# Patient Record
Sex: Female | Born: 1980
Health system: Southern US, Community
[De-identification: ages and names within clinical notes are randomized; demographics above are authoritative.]

## PROBLEM LIST (undated history)

## (undated) DIAGNOSIS — Z8619 Personal history of other infectious and parasitic diseases: Secondary | ICD-10-CM

## (undated) DIAGNOSIS — E785 Hyperlipidemia, unspecified: Secondary | ICD-10-CM

## (undated) HISTORY — DX: Hyperlipidemia, unspecified: E78.5

## (undated) HISTORY — PX: HYSTEROSCOPY WITH D & C: SHX1775

## (undated) HISTORY — DX: Personal history of other infectious and parasitic diseases: Z86.19

---

## 2018-12-26 ENCOUNTER — Encounter: Payer: Self-pay | Admitting: Family Medicine

## 2018-12-26 ENCOUNTER — Other Ambulatory Visit: Payer: Self-pay

## 2018-12-26 ENCOUNTER — Ambulatory Visit (INDEPENDENT_AMBULATORY_CARE_PROVIDER_SITE_OTHER): Payer: BC Managed Care – PPO | Admitting: Family Medicine

## 2018-12-26 VITALS — BP 122/76 | HR 78 | Temp 98.4°F | Resp 12 | Ht 66.0 in | Wt 181.4 lb

## 2018-12-26 DIAGNOSIS — Z13220 Encounter for screening for lipoid disorders: Secondary | ICD-10-CM

## 2018-12-26 DIAGNOSIS — Z13228 Encounter for screening for other metabolic disorders: Secondary | ICD-10-CM

## 2018-12-26 DIAGNOSIS — Z1329 Encounter for screening for other suspected endocrine disorder: Secondary | ICD-10-CM | POA: Diagnosis not present

## 2018-12-26 DIAGNOSIS — Z0000 Encounter for general adult medical examination without abnormal findings: Secondary | ICD-10-CM | POA: Diagnosis not present

## 2018-12-26 DIAGNOSIS — E049 Nontoxic goiter, unspecified: Secondary | ICD-10-CM | POA: Diagnosis not present

## 2018-12-26 DIAGNOSIS — Z130 Encounter for screening for diseases of the blood and blood-forming organs and certain disorders involving the immune mechanism: Secondary | ICD-10-CM

## 2018-12-26 LAB — BASIC METABOLIC PANEL
BUN: 16 mg/dL (ref 6–23)
CO2: 29 mEq/L (ref 19–32)
Calcium: 9.6 mg/dL (ref 8.4–10.5)
Chloride: 105 mEq/L (ref 96–112)
Creatinine, Ser: 0.9 mg/dL (ref 0.40–1.20)
GFR: 70.8 mL/min (ref >60.00)
Glucose, Bld: 86 mg/dL (ref 70–99)
Potassium: 4.1 mEq/L (ref 3.5–5.1)
Sodium: 141 mEq/L (ref 135–145)

## 2018-12-26 LAB — LIPID PANEL
Cholesterol: 207 mg/dL — ABNORMAL HIGH (ref 0–200)
HDL: 65.1 mg/dL (ref >39.00)
LDL Cholesterol: 130 mg/dL — ABNORMAL HIGH (ref 0–99)
NonHDL: 141.49
Total CHOL/HDL Ratio: 3
Triglycerides: 55 mg/dL (ref 0.0–149.0)
VLDL: 11 mg/dL (ref 0.0–40.0)

## 2018-12-26 LAB — TSH: TSH: 0.95 u[IU]/mL (ref 0.35–4.50)

## 2018-12-26 LAB — HEMOGLOBIN A1C: Hgb A1c MFr Bld: 5.3 % (ref 4.6–6.5)

## 2018-12-26 NOTE — Progress Notes (Signed)
HPI:   [name] is a 38 y.o. female, who is here today to establish care.  Former PCP: She moved from [place] a year ago. Last preventive routine visit: 2-3 years ago.  Chronic medical problems: Allergies,recurrent urticaria,HLD (dx'ed as a teenager). Recurrent urticaria for about 8 months. Since she started Claritin 10 mg daily,she has not had episodes or urticaria. She has not identified exacerbating factors.  HLD in her late teens. Report normal FLP 2-3 years ago.  Concerns today: None. She agrees with doing a CPE today.  She lives with her husband and 2 children (5 and 649 yo). She exercises regularly, jogs and bikes. She follows a healthful diet,following We[employer] Watchers.  Last pap smear in 2015. She is planning on establishing with gyn. Denies Hx of abnormal pap smear. G: 3 L:2 A:1 Birth control: Condoms.  Tdap in 2016.  Review of Systems  Constitutional: Negative for appetite change, fatigue and fever.  HENT: Negative for hearing loss, mouth sores, sore throat and trouble swallowing.   Eyes: Negative for redness and visual disturbance.  Respiratory: Negative for cough, shortness of breath and wheezing.   Cardiovascular: Negative for chest pain and leg swelling.  Gastrointestinal: Negative for abdominal pain, nausea and vomiting.       No changes in bowel habits.  Endocrine: Negative for cold intolerance, heat intolerance, polydipsia, polyphagia and polyuria.  Genitourinary: Negative for decreased urine volume, dysuria, hematuria, vaginal bleeding and vaginal discharge.  Musculoskeletal: Negative for gait problem and myalgias.  Skin: Negative for color change and rash.  Allergic/Immunologic: Positive for environmental allergies.  Neurological: Negative for syncope, weakness and headaches.  Hematological: Negative for adenopathy. Does not bruise/bleed easily.  Psychiatric/Behavioral: Negative for confusion and sleep disturbance. The patient is not  nervous/anxious.   All other systems reviewed and are negative.  Current Outpatient Medications on File Prior to Visit  Medication Sig Dispense Refill   b complex vitamins tablet Take 1 tablet by mouth daily.     Biotin 1610910000 MCG TABS Take 1 tablet by mouth daily.     Chlorpheniramine Maleate (ALLERGY PO) Take 1 tablet by mouth daily.     Multiple Vitamins-Minerals (MULTIVITAMIN ADULT) CHEW Chew 1 tablet by mouth daily.     VITAMIN D PO Take 2,000 Units by mouth daily.     No current facility-administered medications on file prior to visit.     Past Medical History:  Diagnosis Date   History of chicken pox    Hyperlipidemia    Allergies  Allergen Reactions   Cashew Nut Oil Hives   Ginger Hives   Macadamia Nut Oil Hives   Pumpkin Seed Hives   Pecan Nut (Diagnostic) Rash   Pistachio Nut (Diagnostic) Rash    Family History  Problem Relation Age of Onset   Arthritis Mother    Diabetes Mother    Cancer Father    Drug abuse Father    Early death Father    Depression Sister    Depression Brother    Arthritis Maternal Grandmother    Depression Maternal Grandmother    Birth defects Maternal Grandfather    Drug abuse Maternal Grandfather    Heart disease Paternal Grandmother    Depression Sister    Learning disabilities Sister     Social History   Socioeconomic History   Marital status: Married    Spouse name: Not on file   Number of children: 2   Years of education: Not on file  Highest education level: Not on file  Occupational History   Not on file  Social Needs   Financial resource strain: Not on file   Food insecurity    Worry: Not on file    Inability: Not on file   Transportation needs    Medical: Not on file    Non-medical: Not on file  Tobacco Use   Smoking status: Never Smoker   Smokeless tobacco: Never Used  Substance and Sexual Activity   Alcohol use: Yes   Drug use: Never   Sexual activity: Yes    Lifestyle   Physical activity    Days per week: 3 days    Minutes per session: 30 min   Stress: Not on file  Relationships   Social connections    Talks on phone: Not on file    Gets together: Not on file    Attends religious service: Not on file    Active member of club or organization: Not on file    Attends meetings of clubs or organizations: Not on file    Relationship status: Not on file  Other Topics Concern   Not on file  Social History Narrative   Not on file    Vitals:   12/26/18 0839  BP: 122/76  Pulse: 78  Resp: 12  Temp: 98.4 F (36.9 C)  SpO2: 98%    Body mass index is 29.27 kg/m.  Physical Exam  Nursing note and vitals reviewed. Constitutional: She is oriented to person, place, and time. She appears well-developed. No distress.  HENT:  Head: Normocephalic and atraumatic.  Right Ear: Hearing, tympanic membrane, external ear and ear canal normal.  Left Ear: Hearing, tympanic membrane, external ear and ear canal normal.  Mouth/Throat: Uvula is midline, oropharynx is clear and moist and mucous membranes are normal.  Eyes: Pupils are equal, round, and reactive to light. Conjunctivae and EOM are normal.  Neck: No tracheal deviation present. Thyromegaly (? right thyroid nodule) present.  Cardiovascular: Normal rate and regular rhythm.  No murmur heard. Pulses:      Dorsalis pedis pulses are 2+ on the right side and 2+ on the left side.  Respiratory: Effort normal and breath sounds normal. No respiratory distress.  GI: Soft. She exhibits no mass. There is no hepatomegaly. There is no abdominal tenderness.  Genitourinary:    Genitourinary Comments: Deferred to gyn.   Musculoskeletal:        General: No edema.     Comments: No major deformity or signs of synovitis appreciated.  Lymphadenopathy:    She has no cervical adenopathy.       Right: No supraclavicular adenopathy present.       Left: No supraclavicular adenopathy present.  Neurological: She  is alert and oriented to person, place, and time. She has normal strength. No cranial nerve deficit. Coordination and gait normal.  Reflex Scores:      Bicep reflexes are 2+ on the right side and 2+ on the left side.      Patellar reflexes are 2+ on the right side and 2+ on the left side. Skin: Skin is warm. No rash noted. No erythema.  Psychiatric: She has a normal mood and affect. Cognition and memory are normal.  Well groomed, good eye contact.    ASSESSMENT AND PLAN:  Ms. Odilia was seen today for establish care and annual exam.  Diagnoses and all orders for this visit:  Lab Results  Component Value Date   HGBA1C 5.3 12/26/2018  Lab Results  Component Value Date   CHOL 207 (H) 12/26/2018   HDL 65.10 12/26/2018   LDLCALC 130 (H) 12/26/2018   TRIG 55.0 12/26/2018   CHOLHDL 3 12/26/2018   Lab Results  Component Value Date   TSH 0.95 12/26/2018   Lab Results  Component Value Date   CREATININE 0.90 12/26/2018   BUN 16 12/26/2018   NA 141 12/26/2018   K 4.1 12/26/2018   CL 105 12/26/2018   CO2 29 12/26/2018    Routine general medical examination at a health care facility We discussed the importance of regular physical activity and healthy diet for prevention of chronic illness and/or complications. Preventive guidelines reviewed. Vaccination up to date,refused influenza vaccine. Continue female preventive care with gyn. Next CPE in a year.  Enlarged thyroid gland ? Right thyroid nodule. Further recommendations will be given according to labs/imaging results.  -     TSH -     US THYROID; Future  Screening for lipoid disorders -     Lipid panel  Screening for endocrine, metabolic and immunity disorder -     Basic metabolic panel -     Hemoglobin A1c    Return in 1 year (on 12/26/2019) for cpe.     Keajah Killough G. Martinique, MD  [facility]. [place] office.

## 2018-12-26 NOTE — Patient Instructions (Addendum)
Today you have you routine preventive visit.  At least 150 minutes of moderate exercise per week, daily brisk walking for 15-30 min is a good exercise option. Healthy diet low in saturated (animal) fats and sweets and consisting of fresh fruits and vegetables, lean meats such as fish and white chicken and whole grains.  These are some of recommendations for screening depending of age and risk factors: A few things to remember from today's visit:   Routine general medical examination at a health care facility  Enlarged thyroid gland - Plan: TSH, US THYROID  Screening for lipoid disorders - Plan: Lipid panel  Screening for endocrine, metabolic and immunity disorder - Plan: Basic metabolic panel, Hemoglobin A1c  Dr Talbert Nan gyn. Specialties and/or Subspecialties Gynecology Only  [phone]   - Vaccines:  Tdap vaccine every 10 years.  Shingles vaccine recommended at age 90, could be given after 36 years of age but not sure about insurance coverage.   Pneumonia vaccines:  Prevnar 13 at 65 and Pneumovax at 47. Sometimes Pneumovax is giving earlier if history of smoking, lung disease,diabetes,kidney disease among some.    Screening for diabetes at age 34 and every 3 years.  Cervical cancer prevention:  Pap smear starts at 36 years of age and continues periodically until 36 years old in low risk women. Pap smear every 3 years between 65 and 61 years old. Pap smear every 3-5 years between women 18 and older if pap smear negative and HPV screening negative.   -Breast cancer: Mammogram: There is disagreement between experts about when to start screening in low risk asymptomatic female but recent recommendations are to start screening at 37 and not later than 36 years old , every 1-2 years and after 36 yo q 2 years. Screening is recommended until 36 years old but some women can continue screening depending of healthy issues.   Colon cancer screening: starts at 36 years old until 36  years old.  Cholesterol disorder screening at age 77 and every 3 years.  Also recommended:  1. Dental visit- Brush and floss your teeth twice daily; visit your dentist twice a year. 2. Eye doctor- Get an eye exam at least every 2 years. 3. Helmet use- Always wear a helmet when riding a bicycle, motorcycle, rollerblading or skateboarding. 4. Safe sex- If you may be exposed to sexually transmitted infections, use a condom. 5. Seat belts- Seat belts can save your live; always wear one. 6. Smoke/Carbon Monoxide detectors- These detectors need to be installed on the appropriate level of your home. Replace batteries at least once a year. 7. Skin cancer- When out in the sun please cover up and use sunscreen 15 SPF or higher. 8. Violence- If anyone is threatening or hurting you, please tell your healthcare provider.  9. Drink alcohol in moderation- Limit alcohol intake to one drink or less per day. Never drink and drive.

## 2019-01-13 ENCOUNTER — Ambulatory Visit
Admission: RE | Admit: 2019-01-13 | Discharge: 2019-01-13 | Disposition: A | Discharge status: Home / Self Care | Payer: BC Managed Care – PPO | Source: Ambulatory Visit | Attending: Family Medicine | Admitting: Family Medicine

## 2019-01-13 DIAGNOSIS — E041 Nontoxic single thyroid nodule: Secondary | ICD-10-CM | POA: Diagnosis not present

## 2019-01-13 DIAGNOSIS — E049 Nontoxic goiter, unspecified: Secondary | ICD-10-CM

## 2019-01-18 ENCOUNTER — Other Ambulatory Visit: Payer: Self-pay | Admitting: Family Medicine

## 2019-01-18 DIAGNOSIS — E041 Nontoxic single thyroid nodule: Secondary | ICD-10-CM

## 2019-01-26 ENCOUNTER — Telehealth: Payer: Self-pay | Admitting: *Deleted

## 2019-01-26 NOTE — Telephone Encounter (Signed)
Copied from [place] [id number]. Topic: Referral - Status >> Jan 25, 2019  4:11 PM Reyne Dumas L wrote: Reason for CRM:   Pt states that she had an ultrasound last week and was told she would have to have a neck biopsy.  Pt states she has had no follow up on that and would like to know what is going on. Pt can be reached at [phone]  Spoke with patient. Per patient spoke with the endocrinologist office, she has an appointment with them  02/15/2019. Per patient was told by Endocrinologist office she needs to have biopsy done before she sees them. Please advise

## 2019-02-01 NOTE — Telephone Encounter (Signed)
Spoke with Ms heyer to discussed thyroid US results and general plan in regard to management.  She is anxious because she is not sure what will be the next step.  Explained that because of characteristics of nodule, biopsy needs to be considered. Procedure may be performed in the office during initial evaluation,if indicated. We discussed Bx technique as well as treatment and prognosis depending of results.  She has an appt with Dr Loanne Drilling on 02/15/19.  She feels reassured and will keep appt with endocrinologist.  Betty Martinique, MD

## 2019-02-15 ENCOUNTER — Other Ambulatory Visit: Payer: Self-pay

## 2019-02-15 ENCOUNTER — Other Ambulatory Visit (HOSPITAL_COMMUNITY)
Admission: RE | Admit: 2019-02-15 | Discharge: 2019-02-15 | Disposition: A | Discharge status: Home / Self Care | Payer: BC Managed Care – PPO | Source: Ambulatory Visit | Attending: Endocrinology | Admitting: Endocrinology

## 2019-02-15 ENCOUNTER — Encounter: Payer: Self-pay | Admitting: Endocrinology

## 2019-02-15 ENCOUNTER — Ambulatory Visit (INDEPENDENT_AMBULATORY_CARE_PROVIDER_SITE_OTHER): Payer: BC Managed Care – PPO | Admitting: Endocrinology

## 2019-02-15 DIAGNOSIS — E041 Nontoxic single thyroid nodule: Secondary | ICD-10-CM | POA: Insufficient documentation

## 2019-02-15 NOTE — Patient Instructions (Addendum)
We'll let you know about the biopsy results.  If as expected, no cancer is found, Please come back for a follow-up appointment in 6 months.   

## 2019-02-15 NOTE — Progress Notes (Signed)
Subjective:    Patient ID: Kristina Rodgers, female    DOB: 05-Jul-1980, 38 y.o.   MRN: [id number]  HPI Pt is referred by Dr Martinique, for nodular thyroid.  Pt was noted on routine exam, to have a nodule at the thyroid in 2020.  she is unaware of ever having had thyroid problems in the past.  she has no h/o XRT or surgery to the neck.    Past Medical History:  Diagnosis Date  . History of chicken pox   . Hyperlipidemia     No past surgical history on file.  Social History   Socioeconomic History  . Marital status: Married    Spouse name: Not on file  . Number of children: 2  . Years of education: Not on file  . Highest education level: Not on file  Occupational History  . Not on file  Social Needs  . Financial resource strain: Not on file  . Food insecurity    Worry: Not on file    Inability: Not on file  . Transportation needs    Medical: Not on file    Non-medical: Not on file  Tobacco Use  . Smoking status: Never Smoker  . Smokeless tobacco: Never Used  Substance and Sexual Activity  . Alcohol use: Yes  . Drug use: Never  . Sexual activity: Yes  Lifestyle  . Physical activity    Days per week: 3 days    Minutes per session: 30 min  . Stress: Not on file  Relationships  . Social Herbalist on phone: Not on file    Gets together: Not on file    Attends religious service: Not on file    Active member of club or organization: Not on file    Attends meetings of clubs or organizations: Not on file    Relationship status: Not on file  . Intimate partner violence    Fear of current or ex partner: Not on file    Emotionally abused: Not on file    Physically abused: Not on file    Forced sexual activity: Not on file  Other Topics Concern  . Not on file  Social History Narrative  . Not on file    Current Outpatient Medications on File Prior to Visit  Medication Sig Dispense Refill  . b complex vitamins tablet Take 1 tablet by mouth daily.    . Biotin  10000 MCG TABS Take 1 tablet by mouth daily.    . Chlorpheniramine Maleate (ALLERGY PO) Take 1 tablet by mouth daily.    . Multiple Vitamins-Minerals (MULTIVITAMIN ADULT) CHEW Chew 1 tablet by mouth daily.    Marland Kitchen VITAMIN D PO Take 2,000 Units by mouth daily.     No current facility-administered medications on file prior to visit.     Allergies  Allergen Reactions  . Cashew Nut Oil Hives  . Ginger Hives  . Macadamia Nut Oil Hives  . Pumpkin Seed Hives  . Pecan Nut (Diagnostic) Rash  . Pistachio Nut (Diagnostic) Rash    Family History  Problem Relation Age of Onset  . Arthritis Mother   . Diabetes Mother   . Cancer Father   . Drug abuse Father   . Early death Father   . Depression Sister   . Thyroid disease Sister   . Depression Brother   . Arthritis Maternal Grandmother   . Depression Maternal Grandmother   . Birth defects Maternal Grandfather   . Drug  abuse Maternal Grandfather   . Heart disease Paternal Grandmother   . Depression Sister   . Learning disabilities Sister     BP 122/80   Pulse 71   Ht 5\' 6"  (1.676 m)   Wt 182 lb (82.6 kg)   SpO2 97%   BMI 29.38 kg/m    Review of Systems Denies hoarseness, neck pain, visual loss, chest pain, sob, cough, dysphagia, diarrhea, itching, flushing, easy bruising, depression, cold intolerance, headache, numbness, and rhinorrhea.  She has lost a few lbs, due to her efforts.      Objective:   Physical Exam VS: see vs page GEN: no distress HEAD: head: no deformity eyes: no periorbital swelling, no proptosis external nose and ears are normal NECK: 3 cm right thyroid nodule is noted.   CHEST WALL: no deformity LUNGS: clear to auscultation CV: reg rate and rhythm, no murmur ABD: abdomen is soft, nontender.  no hepatosplenomegaly.  not distended.  no hernia MUSCULOSKELETAL: muscle bulk and strength are grossly normal.  no obvious joint swelling.  gait is normal and steady EXTEMITIES: no deformity.  no edema PULSES: no  carotid bruit NEURO:  cn 2-12 grossly intact.   readily moves all 4's.  sensation is intact to touch on all 4's SKIN:  Normal texture and temperature.  No rash or suspicious lesion is visible.   NODES:  None palpable at the neck PSYCH: alert, well-oriented.  Does not appear anxious nor depressed.   I have reviewed outside records, and summarized: Pt was incidentally noted to have thyroid nodule, and referred here.  She was euthyroid.  Wellness was also addressed   Lab Results  Component Value Date   TSH 0.95 12/26/2018   02/25/2019: 3.6 cm nodule/mass replacing the mid and inferior aspects of the right lobe of the thyroid meets imaging criteria for bx  thyroid needle bx: consent obtained, signed form on chart The area is first sprayed with cooling agent local: xylocaine 2%, with epinephrine prep: alcohol pad 3 bxs are done with 25 and 27g needles no complications     Assessment & Plan:  Thyroid nodule, new, uncertain etiology   Patient Instructions  We'll let you know about the biopsy results.  If as expected, no cancer is found, Please come back for a follow-up appointment in 6 months.

## 2019-02-17 LAB — CYTOLOGY - NON PAP

## 2019-03-28 ENCOUNTER — Ambulatory Visit: Payer: BC Managed Care – PPO | Attending: Internal Medicine

## 2019-03-28 DIAGNOSIS — Z20822 Contact with and (suspected) exposure to covid-19: Secondary | ICD-10-CM

## 2019-03-30 LAB — NOVEL CORONAVIRUS, NAA: SARS-CoV-2, NAA: NOT DETECTED

## 2019-08-17 ENCOUNTER — Ambulatory Visit: Payer: BC Managed Care – PPO | Admitting: Endocrinology

## 2019-09-26 ENCOUNTER — Ambulatory Visit: Payer: Self-pay | Admitting: Endocrinology

## 2020-01-02 ENCOUNTER — Other Ambulatory Visit (HOSPITAL_COMMUNITY)
Admission: RE | Admit: 2020-01-02 | Discharge: 2020-01-02 | Disposition: A | Discharge status: Home / Self Care | Payer: BLUE CROSS/BLUE SHIELD | Source: Ambulatory Visit | Attending: Family Medicine | Admitting: Family Medicine

## 2020-01-02 ENCOUNTER — Ambulatory Visit (INDEPENDENT_AMBULATORY_CARE_PROVIDER_SITE_OTHER): Payer: BLUE CROSS/BLUE SHIELD | Admitting: Family Medicine

## 2020-01-02 ENCOUNTER — Encounter: Payer: Self-pay | Admitting: Family Medicine

## 2020-01-02 ENCOUNTER — Other Ambulatory Visit: Payer: Self-pay

## 2020-01-02 VITALS — BP 120/78 | HR 68 | Temp 98.2°F | Resp 16 | Ht 66.0 in | Wt 187.4 lb

## 2020-01-02 DIAGNOSIS — Z0000 Encounter for general adult medical examination without abnormal findings: Secondary | ICD-10-CM

## 2020-01-02 DIAGNOSIS — E041 Nontoxic single thyroid nodule: Secondary | ICD-10-CM | POA: Diagnosis not present

## 2020-01-02 DIAGNOSIS — Z124 Encounter for screening for malignant neoplasm of cervix: Secondary | ICD-10-CM

## 2020-01-02 DIAGNOSIS — M2550 Pain in unspecified joint: Secondary | ICD-10-CM

## 2020-01-02 DIAGNOSIS — Z1159 Encounter for screening for other viral diseases: Secondary | ICD-10-CM

## 2020-01-02 DIAGNOSIS — E785 Hyperlipidemia, unspecified: Secondary | ICD-10-CM | POA: Insufficient documentation

## 2020-01-02 DIAGNOSIS — Z1329 Encounter for screening for other suspected endocrine disorder: Secondary | ICD-10-CM

## 2020-01-02 DIAGNOSIS — Z13228 Encounter for screening for other metabolic disorders: Secondary | ICD-10-CM

## 2020-01-02 DIAGNOSIS — Z130 Encounter for screening for diseases of the blood and blood-forming organs and certain disorders involving the immune mechanism: Secondary | ICD-10-CM

## 2020-01-02 NOTE — Patient Instructions (Addendum)
Today you have you routine preventive visit. A few things to remember from today's visit:  Routine general medical examination at a health care facility  Encounter for HCV screening test for low risk patient - Plan: Hepatitis C antibody  Hyperlipidemia, unspecified hyperlipidemia type - Plan: Lipid panel  Polyarthralgia - Plan: Sedimentation rate, C-reactive protein, Rheumatoid factor, Cyclic citrul peptide antibody, IgG  Right thyroid nodule - Plan: US THYROID, TSH  Screening for endocrine, metabolic and immunity disorder - Plan: BASIC METABOLIC PANEL WITH GFR, Hemoglobin A1c  Cervical cancer screening - Plan: Cytology - PAP ([facility])  Please be sure medication list is accurate. If a new problem present, please set up appointment sooner than planned today.  At least 150 minutes of moderate exercise per week, daily brisk walking for 15-30 min is a good exercise option. Healthy diet low in saturated (animal) fats and sweets and consisting of fresh fruits and vegetables, lean meats such as fish and white chicken and whole grains.  These are some of recommendations for screening depending of age and risk factors:  - Vaccines:  Tdap vaccine every 10 years.  Shingles vaccine recommended at age 58, could be given after 37 years of age but not sure about insurance coverage.   Pneumonia vaccines: Pneumovax at 65. Sometimes Pneumovax is giving earlier if history of smoking, lung disease,diabetes,kidney disease among some.  Screening for diabetes at age 61 and every 3 years.  Cervical cancer prevention:  Pap smear starts at 37 years of age and continues periodically until 37 years old in low risk women. Pap smear every 3 years between 17 and 59 years old. Pap smear every 3-5 years between women 30 and older if pap smear negative and HPV screening negative.   -Breast cancer: Mammogram: There is disagreement between experts about when to start screening in low risk asymptomatic female  but recent recommendations are to start screening at 87 and not later than 37 years old , every 1-2 years and after 37 yo q 2 years. Screening is recommended until 37 years old but some women can continue screening depending of healthy issues.  Colon cancer screening: Has been recently changed to 36 yo. Insurance may not cover until you are 37 years old. Screening is recommended until 37 years old.  Cholesterol disorder screening at age 11 and every 3 years.N/A  Also recommended:  1. Dental visit- Brush and floss your teeth twice daily; visit your dentist twice a year. 2. Eye doctor- Get an eye exam at least every 2 years. 3. Helmet use- Always wear a helmet when riding a bicycle, motorcycle, rollerblading or skateboarding. 4. Safe sex- If you may be exposed to sexually transmitted infections, use a condom. 5. Seat belts- Seat belts can save your live; always wear one. 6. Smoke/Carbon Monoxide detectors- These detectors need to be installed on the appropriate level of your home. Replace batteries at least once a year. 7. Skin cancer- When out in the sun please cover up and use sunscreen 15 SPF or higher. 8. Violence- If anyone is threatening or hurting you, please tell your healthcare provider.  9. Drink alcohol in moderation- Limit alcohol intake to one drink or less per day. Never drink and drive. 10. Calcium supplementation 1000 to 1200 mg daily, ideally through your diet.  Vitamin D supplementation 800 units daily.

## 2020-01-02 NOTE — Progress Notes (Addendum)
HPI: [name] is a pleasant 39 y.o. female, who is here today for her routine physical.  Last CPE: 12/26/19.  Regular exercise 3 or more time per week: She has been exercising regularly for the past 3 weeks. Hiking during weekends. Following a healthy diet: Started [employer] 3 weeks ago and has already lost 7 Lb She lives with her husband and 2 children.  Chronic medical problems: HLD and thyroid nodule.  Pap smear: 2015 She has not established with gyn. Hx of abnormal pap smears: Negative.  Immunization History  Administered Date(s) Administered  . Tdap 08/06/2014   Mammogram: N/A Colonoscopy: N/A DEXA: N/A  Hep C screening: Never  Concerns today: Joint pain, she is concerned about inflammatory etiology. "Sore" joints, hands mainly: MCP,wrist,and ankle. Morning stiffness that last < 1 hour. Occasional edema, no erythema.  MGM with RA. Her mother was dx'ed with RA years ago but recently evaluated by rheumatologist and was determined that she did not have RA but rather OA. No limitation of ROM or deformities.  Reports that she recently had eye exam and vision has changed a lot.  HLD: She is on non pharmacologic treatment.  Component     Latest Ref Rng & Units 12/26/2018  Cholesterol     <200 mg/dL 588 (H)  Triglycerides     <150 mg/dL 50.2  HDL Cholesterol     > OR = 50 mg/dL 77.41  VLDL     0.0 - 28.7 mg/dL 86.7  LDL (calc)     0 - 99 mg/dL 672 (H)  Total CHOL/HDL Ratio     <5.0 (calc) 3  NonHDL      141.49   She was following with endocrinologist, Dr Everardo All; he is not longer in her network. Last TSH 0.95 in 12/2018. Thyroid US: 1030/20 The approximately 3.6 cm nodule/mass replacing the mid and inferior aspects of the right lobe of the thyroid meets imaging criteria to recommend percutaneous sampling as clinically indicated.  Thyroid nodule Bx on 02/15/20:  Clinical History: None provided  Specimen Submitted: A. THYROID, RIGHT, FINE  NEEDLE ASPIRATION:  FINAL MICROSCOPIC DIAGNOSIS:  - Consistent with benign follicular nodule (Bethesda category II)  SPECIMEN ADEQUACY:  Satisfactory for evaluation   Review of Systems  Constitutional: Negative for appetite change, fatigue and fever.  HENT: Negative for dental problem, hearing loss, mouth sores and sore throat.   Eyes: Negative for redness and visual disturbance.  Respiratory: Negative for cough, shortness of breath and wheezing.   Cardiovascular: Negative for chest pain and leg swelling.  Gastrointestinal: Negative for abdominal pain, nausea and vomiting.       No changes in bowel habits.  Endocrine: Negative for cold intolerance, heat intolerance, polydipsia, polyphagia and polyuria.  Genitourinary: Negative for decreased urine volume, dysuria, hematuria, vaginal bleeding and vaginal discharge.  Musculoskeletal: Positive for arthralgias. Negative for gait problem and myalgias.  Skin: Negative for color change and rash.  Allergic/Immunologic: Positive for environmental allergies.  Neurological: Negative for syncope, weakness and headaches.  Hematological: Negative for adenopathy. Does not bruise/bleed easily.  Psychiatric/Behavioral: Negative for confusion. The patient is not nervous/anxious.   All other systems reviewed and are negative.  Current Outpatient Medications on File Prior to Visit  Medication Sig Dispense Refill  . b complex vitamins tablet Take 1 tablet by mouth daily.    . Biotin 09470 MCG TABS Take 1 tablet by mouth daily.    . Chlorpheniramine Maleate (ALLERGY PO) Take 1 tablet by mouth daily.    Marland Kitchen  Multiple Vitamins-Minerals (MULTIVITAMIN ADULT) CHEW Chew 1 tablet by mouth daily.    Marland Kitchen VITAMIN D PO Take 2,000 Units by mouth daily.     No current facility-administered medications on file prior to visit.   Past Medical History:  Diagnosis Date  . History of chicken pox   . Hyperlipidemia    History reviewed. No pertinent surgical  history.  Allergies  Allergen Reactions  . Cashew Nut Oil Hives  . Ginger Hives  . Macadamia Nut Oil Hives  . Pumpkin Seed Hives  . Pecan Nut (Diagnostic) Rash  . Pistachio Nut (Diagnostic) Rash    Family History  Problem Relation Age of Onset  . Arthritis Mother   . Diabetes Mother   . Cancer Father   . Drug abuse Father   . Early death Father   . Depression Sister   . Thyroid disease Sister   . Depression Brother   . Arthritis Maternal Grandmother   . Depression Maternal Grandmother   . Birth defects Maternal Grandfather   . Drug abuse Maternal Grandfather   . Heart disease Paternal Grandmother   . Depression Sister   . Learning disabilities Sister     Social History   Socioeconomic History  . Marital status: Married    Spouse name: Not on file  . Number of children: 2  . Years of education: Not on file  . Highest education level: Not on file  Occupational History  . Not on file  Tobacco Use  . Smoking status: Never Smoker  . Smokeless tobacco: Never Used  Vaping Use  . Vaping Use: Never used  Substance and Sexual Activity  . Alcohol use: Yes  . Drug use: Never  . Sexual activity: Yes  Other Topics Concern  . Not on file  Social History Narrative  . Not on file   Social Determinants of Health   Financial Resource Strain:   . Difficulty of Paying Living Expenses: Not on file  Food Insecurity:   . Worried About Programme researcher, broadcasting/film/video in the Last Year: Not on file  . Ran Out of Food in the Last Year: Not on file  Transportation Needs:   . Lack of Transportation (Medical): Not on file  . Lack of Transportation (Non-Medical): Not on file  Physical Activity:   . Days of Exercise per Week: Not on file  . Minutes of Exercise per Session: Not on file  Stress:   . Feeling of Stress : Not on file  Social Connections:   . Frequency of Communication with Friends and Family: Not on file  . Frequency of Social Gatherings with Friends and Family: Not on file  .  Attends Religious Services: Not on file  . Active Member of Clubs or Organizations: Not on file  . Attends Banker Meetings: Not on file  . Marital Status: Not on file    Vitals:   01/02/20 1500  BP: 120/78  Pulse: 68  Resp: 16  Temp: 98.2 F (36.8 C)  SpO2: 99%   Body mass index is 30.25 kg/m.  Wt Readings from Last 3 Encounters:  01/02/20 187 lb 6.4 oz (85 kg)  02/15/19 182 lb (82.6 kg)  12/26/18 181 lb 6 oz (82.3 kg)   Physical Exam Vitals and nursing note reviewed. Exam conducted with a chaperone present.  Constitutional:      General: She is not in acute distress.    Appearance: She is well-developed.  HENT:     Head: Normocephalic  and atraumatic.     Right Ear: Hearing, tympanic membrane, ear canal and external ear normal.     Left Ear: Hearing, tympanic membrane, ear canal and external ear normal.     Mouth/Throat:     Mouth: Mucous membranes are moist.     Pharynx: Oropharynx is clear. Uvula midline.  Eyes:     Extraocular Movements: Extraocular movements intact.     Conjunctiva/sclera: Conjunctivae normal.     Pupils: Pupils are equal, round, and reactive to light.  Neck:     Thyroid: Thyromegaly present.     Trachea: No tracheal deviation.  Cardiovascular:     Rate and Rhythm: Normal rate and regular rhythm.     Pulses:          Dorsalis pedis pulses are 2+ on the right side and 2+ on the left side.     Heart sounds: No murmur heard.   Pulmonary:     Effort: Pulmonary effort is normal. No respiratory distress.     Breath sounds: Normal breath sounds.  Abdominal:     Palpations: Abdomen is soft. There is no hepatomegaly or mass.     Tenderness: There is no abdominal tenderness.  Genitourinary:    Labia:        Right: No rash, tenderness or lesion.        Left: No rash, tenderness or lesion.      Vagina: Vaginal discharge present. No erythema, tenderness or bleeding.     Cervix: No cervical motion tenderness or friability.     Uterus:  Not enlarged and not tender.      Adnexa:        Right: No mass, tenderness or fullness.         Left: No mass, tenderness or fullness.       Comments: Breast: No masses, skin abnormalities, or nipple discharge appreciated bilateral. Pap smear collected.  Musculoskeletal:     Comments: No major deformity or signs of synovitis appreciated.  Lymphadenopathy:     Cervical: No cervical adenopathy.     Upper Body:     Right upper body: No supraclavicular or axillary adenopathy.     Left upper body: No supraclavicular or axillary adenopathy.     Lower Body: No right inguinal adenopathy. No left inguinal adenopathy.  Skin:    General: Skin is warm.     Findings: No erythema or rash.  Neurological:     General: No focal deficit present.     Mental Status: She is alert and oriented to person, place, and time.     Cranial Nerves: No cranial nerve deficit.     Coordination: Coordination normal.     Gait: Gait normal.     Deep Tendon Reflexes:     Reflex Scores:      Bicep reflexes are 2+ on the right side and 2+ on the left side.      Patellar reflexes are 2+ on the right side and 2+ on the left side. Psychiatric:        Mood and Affect: Mood and affect normal.     Comments: Well groomed, good eye contact.   ASSESSMENT AND PLAN:  Ms. Roney JaffeJennifer Layson was here today annual physical examination.  Orders Placed This Encounter  Procedures  . US THYROID  . BASIC METABOLIC PANEL WITH GFR  . Hemoglobin A1c  . Lipid panel  . TSH  . Sedimentation rate  . C-reactive protein  . Rheumatoid factor  . Cyclic  citrul peptide antibody, IgG  . Hepatitis C antibody   Lab Results  Component Value Date   TSH 1.39 01/02/2020   Lab Results  Component Value Date   CREATININE 0.85 01/02/2020   BUN 11 01/02/2020   NA 139 01/02/2020   K 3.9 01/02/2020   CL 104 01/02/2020   CO2 25 01/02/2020   Lab Results  Component Value Date   ESRSEDRATE 2 01/02/2020   Lab Results  Component Value Date    CRP 2.5 01/02/2020   Lab Results  Component Value Date   HGBA1C 5.0 01/02/2020   Lab Results  Component Value Date   CHOL 225 (H) 01/02/2020   HDL 68 01/02/2020   LDLCALC 143 (H) 01/02/2020   TRIG 58 01/02/2020   CHOLHDL 3.3 01/02/2020   Routine general medical examination at a health care facility We discussed the importance of regular physical activity and healthy diet for prevention of chronic illness and/or complications. Preventive guidelines reviewed. Vaccination up to date.  Next CPE in a year.  Encounter for HCV screening test for low risk patient -     Hepatitis C antibody  Hyperlipidemia, unspecified hyperlipidemia type Continue non pharmacologic treatment. Continue low fat diet and regular physical activity.  Polyarthralgia Possible etiologies discussed. No signs of synovitis today. ? OA. Further recommendations according to lab results.  Right thyroid nodule Stable. Will arrange thyroid US and TSH. Further recommendations according to imaging/lab result.  Screening for endocrine, metabolic and immunity disorder -     Hemoglobin A1c -     BASIC METABOLIC PANEL WITH GFR  Cervical cancer screening -     Cytology - PAP ([place])   Return in 1 year (on 01/01/2021) for cpe.  Brylea Pita G. [place], MD  [facility]. [place] office.   Today you have you routine preventive visit. A few things to remember from today's visit:  Routine general medical examination at a health care facility  Encounter for HCV screening test for low risk patient - Plan: Hepatitis C antibody  Hyperlipidemia, unspecified hyperlipidemia type - Plan: Lipid panel  Polyarthralgia - Plan: Sedimentation rate, C-reactive protein, Rheumatoid factor, Cyclic citrul peptide antibody, IgG  Right thyroid nodule - Plan: US THYROID, TSH  Screening for endocrine, metabolic and immunity disorder - Plan: BASIC METABOLIC PANEL WITH GFR, Hemoglobin A1c  Cervical cancer screening  - Plan: Cytology - PAP ()  Please be sure medication list is accurate. If a new problem present, please set up appointment sooner than planned today.  At least 150 minutes of moderate exercise per week, daily brisk walking for 15-30 min is a good exercise option. Healthy diet low in saturated (animal) fats and sweets and consisting of fresh fruits and vegetables, lean meats such as fish and white chicken and whole grains.  These are some of recommendations for screening depending of age and risk factors:  - Vaccines:  Tdap vaccine every 10 years.  Shingles vaccine recommended at age 31, could be given after 37 years of age but not sure about insurance coverage.   Pneumonia vaccines: Pneumovax at 65. Sometimes Pneumovax is giving earlier if history of smoking, lung disease,diabetes,kidney disease among some.  Screening for diabetes at age 57 and every 3 years.  Cervical cancer prevention:  Pap smear starts at 37 years of age and continues periodically until 37 years old in low risk women. Pap smear every 3 years between 64 and 61 years old. Pap smear every 3-5 years between women 30 and older if  pap smear negative and HPV screening negative.   -Breast cancer: Mammogram: There is disagreement between experts about when to start screening in low risk asymptomatic female but recent recommendations are to start screening at 61 and not later than 37 years old , every 1-2 years and after 37 yo q 2 years. Screening is recommended until 37 years old but some women can continue screening depending of healthy issues.  Colon cancer screening: Has been recently changed to 37 yo. Insurance may not cover until you are 37 years old. Screening is recommended until 37 years old.  Cholesterol disorder screening at age 30 and every 3 years.N/A  Also recommended:  1. Dental visit- Brush and floss your teeth twice daily; visit your dentist twice a year. 2. Eye doctor- Get an eye exam at least  every 2 years. 3. Helmet use- Always wear a helmet when riding a bicycle, motorcycle, rollerblading or skateboarding. 4. Safe sex- If you may be exposed to sexually transmitted infections, use a condom. 5. Seat belts- Seat belts can save your live; always wear one. 6. Smoke/Carbon Monoxide detectors- These detectors need to be installed on the appropriate level of your home. Replace batteries at least once a year. 7. Skin cancer- When out in the sun please cover up and use sunscreen 15 SPF or higher. 8. Violence- If anyone is threatening or hurting you, please tell your healthcare provider.  9. Drink alcohol in moderation- Limit alcohol intake to one drink or less per day. Never drink and drive. 10. Calcium supplementation 1000 to 1200 mg daily, ideally through your diet.  Vitamin D supplementation 800 units daily.

## 2020-01-03 LAB — LIPID PANEL
Cholesterol: 225 mg/dL — ABNORMAL HIGH (ref <200)
HDL: 68 mg/dL (ref >=50)
LDL Cholesterol (Calc): 143 mg/dL (calc) — ABNORMAL HIGH
Non-HDL Cholesterol (Calc): 157 mg/dL (calc) — ABNORMAL HIGH (ref <130)
Total CHOL/HDL Ratio: 3.3 (calc) (ref <5.0)
Triglycerides: 58 mg/dL (ref <150)

## 2020-01-03 LAB — BASIC METABOLIC PANEL WITH GFR
BUN: 11 mg/dL (ref 7–25)
CO2: 25 mmol/L (ref 20–32)
Calcium: 9.6 mg/dL (ref 8.6–10.2)
Chloride: 104 mmol/L (ref 98–110)
Creat: 0.85 mg/dL (ref 0.50–1.10)
GFR, Est African American: 101 mL/min/{1.73_m2} (ref >=60)
GFR, Est Non African American: 88 mL/min/{1.73_m2} (ref >=60)
Glucose, Bld: 84 mg/dL (ref 65–99)
Potassium: 3.9 mmol/L (ref 3.5–5.3)
Sodium: 139 mmol/L (ref 135–146)

## 2020-01-03 LAB — C-REACTIVE PROTEIN: CRP: 2.5 mg/L (ref <8.0)

## 2020-01-03 LAB — HEPATITIS C ANTIBODY
Hepatitis C Ab: NONREACTIVE
SIGNAL TO CUT-OFF: 0.02 (ref <1.00)

## 2020-01-03 LAB — TSH: TSH: 1.39 mIU/L

## 2020-01-03 LAB — CYCLIC CITRUL PEPTIDE ANTIBODY, IGG: Cyclic Citrullin Peptide Ab: 22 UNITS — ABNORMAL HIGH

## 2020-01-03 LAB — HEMOGLOBIN A1C
Hgb A1c MFr Bld: 5 % of total Hgb (ref <5.7)
Mean Plasma Glucose: 97 (calc)
eAG (mmol/L): 5.4 (calc)

## 2020-01-03 LAB — SEDIMENTATION RATE: Sed Rate: 2 mm/h (ref 0–20)

## 2020-01-03 LAB — RHEUMATOID FACTOR: Rheumatoid fact SerPl-aCnc: <14 IU/mL (ref <14)

## 2020-01-04 LAB — CYTOLOGY - PAP
Comment: NEGATIVE
Diagnosis: NEGATIVE
High risk HPV: NEGATIVE

## 2020-01-15 ENCOUNTER — Ambulatory Visit
Admission: RE | Admit: 2020-01-15 | Discharge: 2020-01-15 | Disposition: A | Discharge status: Home / Self Care | Payer: BLUE CROSS/BLUE SHIELD | Source: Ambulatory Visit | Attending: Family Medicine | Admitting: Family Medicine

## 2020-01-15 DIAGNOSIS — E041 Nontoxic single thyroid nodule: Secondary | ICD-10-CM

## 2020-01-22 ENCOUNTER — Other Ambulatory Visit: Payer: Self-pay

## 2020-01-22 DIAGNOSIS — E041 Nontoxic single thyroid nodule: Secondary | ICD-10-CM

## 2020-01-22 DIAGNOSIS — M2550 Pain in unspecified joint: Secondary | ICD-10-CM

## 2020-02-05 NOTE — Progress Notes (Signed)
Office Visit Note  Patient: Kristina Rodgers             Date of Birth: July 01, 1980           MRN: [id number]             PCP: Martinique, Betty G, MD Referring: Martinique, Betty G, MD Visit Date: 02/06/2020  Subjective:   History of Present Illness: Kristina Rodgers is a 39 y.o. female here for evaluation of arthralgias. She has been experiencing joint pains off and on for about 7 years, since after her second child was born. She also has persistent fatigue varying in severity but somewhat worsening over time.  Currently her primary issues are in the hands wrists and elbows of both arms where she does have pain and stiffness.  She also has pain in the knees sometimes much less bothersome than in her hands.  She does not typically notice any associated swelling or redness of the joints.  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  Besides joint pain she also has a history of chronic idiopathic urticaria.  This is responsive to antihistamines which she takes daily for maintenance.  Previous work-up for this never revealed any underlying process causing her rashes.  Currently is well controlled without symptoms today. Otherwise she has been in fairly good health.  Labs reviewed 12/2019 CCP 22 RF negative ESR 2 CRP 2.5 HCV negative   Activities of Daily Living:  Patient reports morning stiffness for 1-24 hours.    Patient Reports nocturnal pain.  Difficulty dressing/grooming: Denies Difficulty climbing stairs: Denies Difficulty getting out of chair: Denies Difficulty using hands for taps, buttons, cutlery, and/or writing: Reports  Review of Systems  Constitutional: Positive for fatigue.  HENT: Negative for mouth sores, mouth dryness and nose dryness.   Eyes: Positive for visual disturbance and dryness. Negative for pain and itching.  Respiratory: Negative for cough, hemoptysis, shortness of breath and  difficulty breathing.   Cardiovascular: Negative for chest pain, palpitations and swelling in legs/feet.  Gastrointestinal: Negative for abdominal pain, blood in stool, constipation and diarrhea.  Endocrine: Negative for increased urination.  Genitourinary: Negative for painful urination.  Musculoskeletal: Positive for arthralgias, joint pain, muscle weakness and morning stiffness. Negative for joint swelling, myalgias, muscle tenderness and myalgias.  Skin: Negative for color change, rash and redness.  Allergic/Immunologic: Negative for susceptible to infections.  Neurological: Negative for dizziness, numbness, headaches, memory loss and weakness.  Hematological: Positive for swollen glands.  Psychiatric/Behavioral: Negative for confusion and sleep disturbance.    PMFS History:  Patient Active Problem List   Diagnosis Date Noted  . Positive anti-CCP test 02/06/2020  . Bilateral arm pain 02/06/2020  . Hyperlipidemia 01/02/2020  . Right thyroid nodule 02/15/2019    Past Medical History:  Diagnosis Date  . History of chicken pox   . Hyperlipidemia     Family History  Problem Relation Age of Onset  . Arthritis Mother   . Diabetes Mother   . Cancer Father   . Drug abuse Father   . Early death Father   . Depression Sister   . Thyroid disease Sister   . Depression Brother   . Arthritis Maternal Grandmother   . Depression Maternal Grandmother   . Birth defects Maternal Grandfather   . Drug abuse Maternal Grandfather   . Heart disease Paternal Grandmother   . Depression Sister   .  Learning disabilities Sister    Past Surgical History:  Procedure Laterality Date  . HYSTEROSCOPY WITH D & C     Per patient   Social History   Social History Narrative  . Not on file   Immunization History  Administered Date(s) Administered  . Tdap 08/06/2014     Objective: Vital Signs: BP 116/79 (BP Location: Left Arm, Patient Position: Sitting, Cuff Size: Small)   Pulse 76   Ht 5'  6.25" (1.683 m)   Wt 186 lb 9.6 oz (84.6 kg)   BMI 29.89 kg/m    Physical Exam HENT:     Right Ear: External ear normal.     Left Ear: External ear normal.     Mouth/Throat:     Mouth: Mucous membranes are moist.     Pharynx: Oropharynx is clear.  Eyes:     Conjunctiva/sclera: Conjunctivae normal.  Cardiovascular:     Rate and Rhythm: Normal rate and regular rhythm.  Pulmonary:     Effort: Pulmonary effort is normal.     Breath sounds: Normal breath sounds.  Skin:    General: Skin is warm and dry.     Findings: No rash.  Neurological:     General: No focal deficit present.     Mental Status: She is alert.  Psychiatric:        Mood and Affect: Mood normal.     Musculoskeletal Exam:  Neck full range of motion no tenderness Shoulder, elbow, wrist, fingers full range of motion no tenderness or swelling Some paraspinal tenderness to palpation over upper and lower back Normal hip internal and external rotation without pain Knees, ankles, MTPs full range of motion no tenderness or swelling   Investigation: No additional findings.  Imaging: US THYROID  Result Date: 01/15/2020 CLINICAL DATA:  Thyroid nodule EXAM: THYROID ULTRASOUND TECHNIQUE: Ultrasound examination of the thyroid gland and adjacent soft tissues was performed. COMPARISON:  01/13/2019 FINDINGS: Parenchymal Echotexture: Mildly heterogenous Isthmus: 4 mm Right lobe: 6.1 x 2.2 x 2.4 cm Left lobe: 5.7 x 1.1 x 1.6 cm _________________________________________________________ Estimated total number of nodules >/= 1 cm: 1 Number of spongiform nodules >/=  2 cm not described below (TR1): 0 Number of mixed cystic and solid nodules >/= 1.5 cm not described below ([place]): 0 _________________________________________________________ Nodule # 1: Location: Right; Inferior Maximum size: 3.8, previously 3.6 cm; Other 2 dimensions: 2.5 x 1.6 cm Composition: solid/almost completely solid (2) Echogenicity: isoechoic (1) Shape: not  taller-than-wide (0) Margins: ill-defined (0) Echogenic foci: none (0) ACR TI-RADS total points: 3. ACR TI-RADS risk category: TR3 (3 points). ACR TI-RADS recommendations: **Given size (>/= 2.5 cm) and appearance, fine needle aspiration of this mildly suspicious nodule should be considered based on TI-RADS criteria. _________________________________________________________ Stable 8 mm left inferior thyroid hypoechoic nodule which does not meet criteria for any biopsy or follow-up. Normal vascularity.  No regional adenopathy. IMPRESSION: 3.8 cm right inferior TR 3 nodule meets criteria for biopsy as above. No significant interval change compared to 01/13/2019. The above is in keeping with the ACR TI-RADS recommendations - J Am Coll Radiol 2017;14:587-595. Electronically Signed   By: Jerilynn Mages.  Shick M.D.   On: 01/15/2020 15:50   XR Hand 2 View Left  Result Date: 02/06/2020 X-ray left hand 2 views Radiocarpal carpal joint space.  Normal.  Normal MCP, PIP, DIP joint spaces.  No osteophytes or erosions are seen.  Bone mineralization appears normal.  No soft tissue swelling seen. Impression No degenerative or inflammatory arthritis abnormalities  seen  XR Hand 2 View Right  Result Date: 02/06/2020 X-ray right hand 2 views Radiocarpal and carpal joint spaces appear normal.  MCP, PIP, DIP joint space appears normal.  Bone mineralization appears normal.  No soft tissue swelling seen. Impression No degenerative inflammatory arthritis abnormalities are seen   Recent Labs: Lab Results  Component Value Date   NA 139 01/02/2020   K 3.9 01/02/2020   CL 104 01/02/2020   CO2 25 01/02/2020   GLUCOSE 84 01/02/2020   BUN 11 01/02/2020   CREATININE 0.85 01/02/2020   CALCIUM 9.6 01/02/2020   GFRAA 101 01/02/2020    Speciality Comments: No specialty comments available.  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  Discussed possibility of noninflammatory process such as fibromyalgia and myofascial pain although this to be diagnosis of exclusion.  Will also check serum vitamin D level and will repeat ESR and CCP tests in case previous was a transient elevation due to the low positive titer.  No objective findings indicating immunosuppressive treatment at this time may need follow-up if more positive serology otherwise will discuss nonpharmacological treatment options.  Orders: Orders Placed This Encounter  Procedures  . XR Hand 2 View Left  . XR Hand 2 View Right  . VITAMIN D 25 Hydroxy (Vit-D Deficiency, Fractures)  . Sedimentation rate  . Cyclic citrul peptide antibody, IgG   No orders of the defined types were placed in this encounter.   Follow-Up Instructions: No follow-ups on file.   Collier Salina, MD  Note - This record has been created using [employer].  Chart creation errors have been sought, but may not always  have been located. Such creation errors do not reflect on  the standard of medical care.

## 2020-02-06 ENCOUNTER — Encounter: Payer: Self-pay | Admitting: Internal Medicine

## 2020-02-06 ENCOUNTER — Ambulatory Visit: Payer: Self-pay

## 2020-02-06 ENCOUNTER — Other Ambulatory Visit: Payer: Self-pay

## 2020-02-06 ENCOUNTER — Ambulatory Visit (INDEPENDENT_AMBULATORY_CARE_PROVIDER_SITE_OTHER): Payer: BC Managed Care – PPO | Admitting: Internal Medicine

## 2020-02-06 VITALS — BP 116/79 | HR 76 | Ht 66.25 in | Wt 186.6 lb

## 2020-02-06 DIAGNOSIS — R768 Other specified abnormal immunological findings in serum: Secondary | ICD-10-CM | POA: Diagnosis not present

## 2020-02-06 DIAGNOSIS — E559 Vitamin D deficiency, unspecified: Secondary | ICD-10-CM | POA: Diagnosis not present

## 2020-02-06 DIAGNOSIS — M79602 Pain in left arm: Secondary | ICD-10-CM | POA: Diagnosis not present

## 2020-02-06 DIAGNOSIS — M79601 Pain in right arm: Secondary | ICD-10-CM

## 2020-02-06 DIAGNOSIS — R7681 Abnormal rheumatoid factor and anti-citrullinated protein antibody without rheumatoid arthritis: Secondary | ICD-10-CM | POA: Insufficient documentation

## 2020-02-07 ENCOUNTER — Ambulatory Visit: Payer: BLUE CROSS/BLUE SHIELD | Admitting: Internal Medicine

## 2020-02-07 LAB — SEDIMENTATION RATE: Sed Rate: 6 mm/h (ref 0–20)

## 2020-02-07 LAB — VITAMIN D 25 HYDROXY (VIT D DEFICIENCY, FRACTURES): Vit D, 25-Hydroxy: 30 ng/mL (ref 30–100)

## 2020-02-07 LAB — CYCLIC CITRUL PEPTIDE ANTIBODY, IGG: Cyclic Citrullin Peptide Ab: 19 UNITS

## 2020-02-21 NOTE — Progress Notes (Signed)
Lab tests including CCP antibodies which are test for rheumatoid arthritis and sedimentation rate which is a marker for inflammation are negative. Vitamin D is now 30 normal is 30 or greater. Based on these results and our exam I do not see any evidence of inflammatory arthritis so do not think additional rheumatology testing or treatment is needed at this time.

## 2020-03-20 ENCOUNTER — Other Ambulatory Visit: Payer: BC Managed Care – PPO

## 2021-03-03 IMAGING — US US THYROID
1 series · 13 of 25 positions shown · non-contrast
Comparison: None.

CLINICAL DATA: Palpable abnormality.  Palpable thyroid nodule.

EXAM:
THYROID ULTRASOUND
TECHNIQUE: Ultrasound examination of the thyroid gland and adjacent soft
tissues was performed.

[Series 1: us thyroid · 0.08mm/px · 13 of 44 slices shown]
[im 1/44]
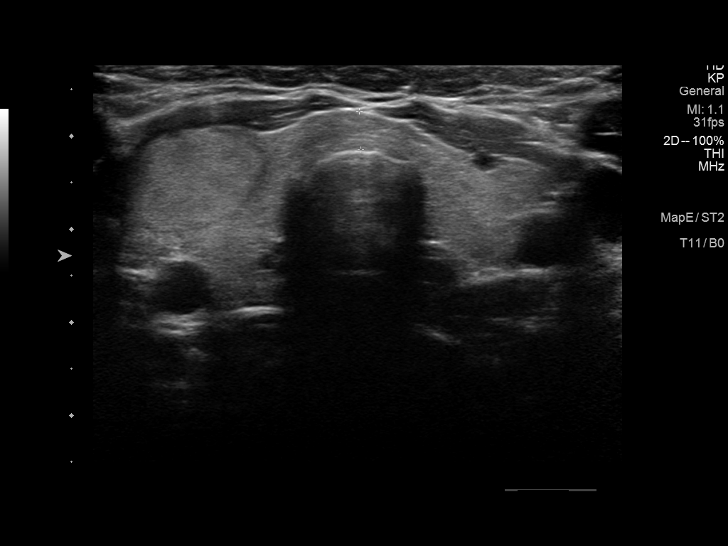
[im 4/44]
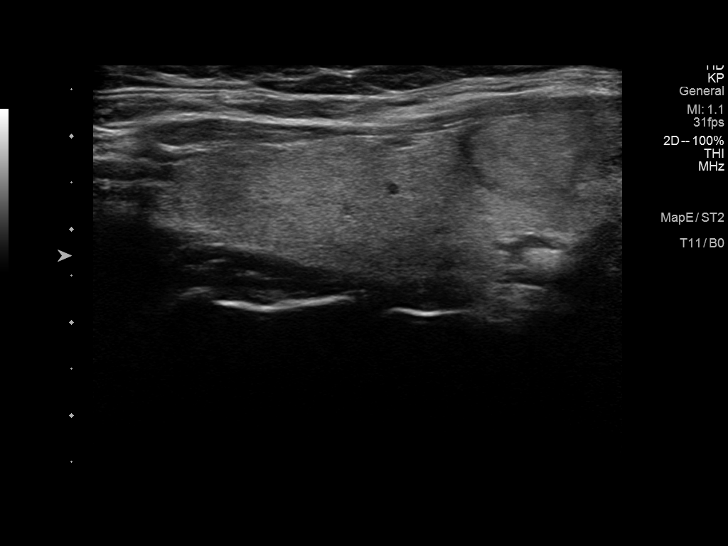
[im 8/44]
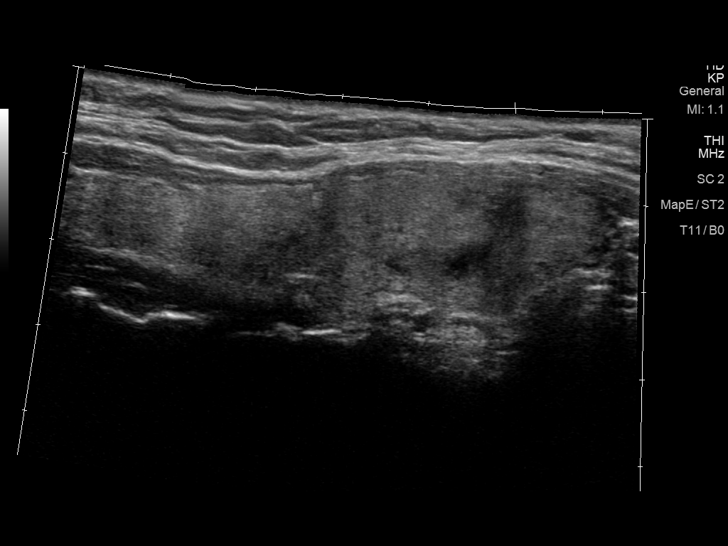
[im 11/44]
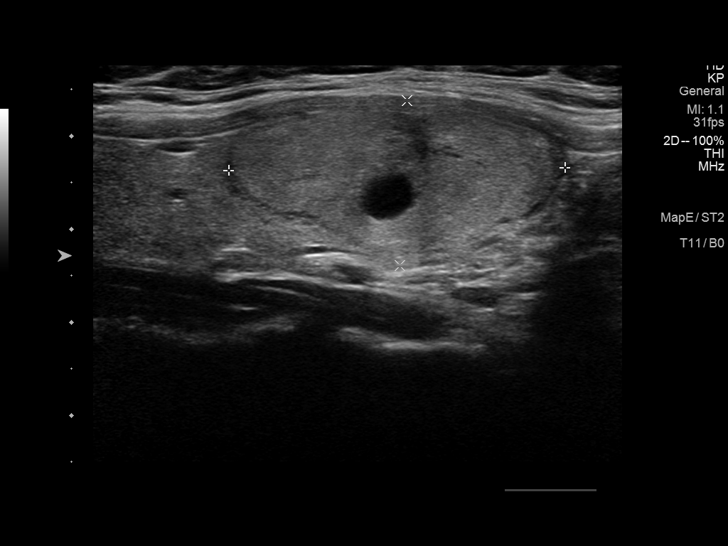
[im 15/44]
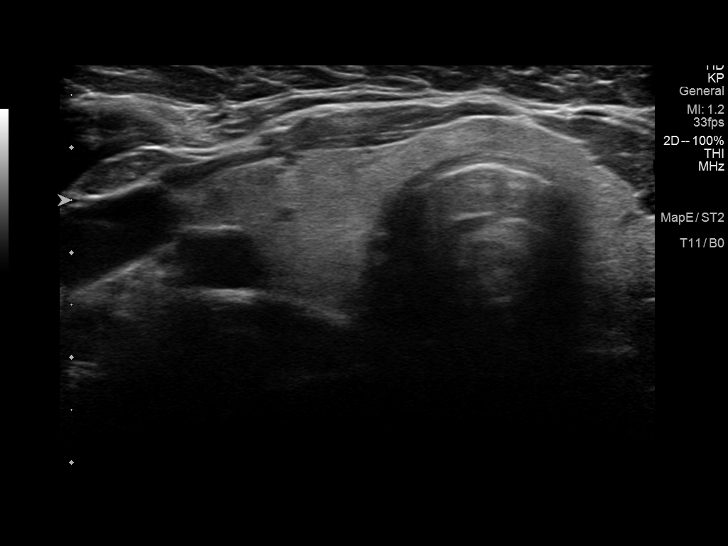
[im 18/44]
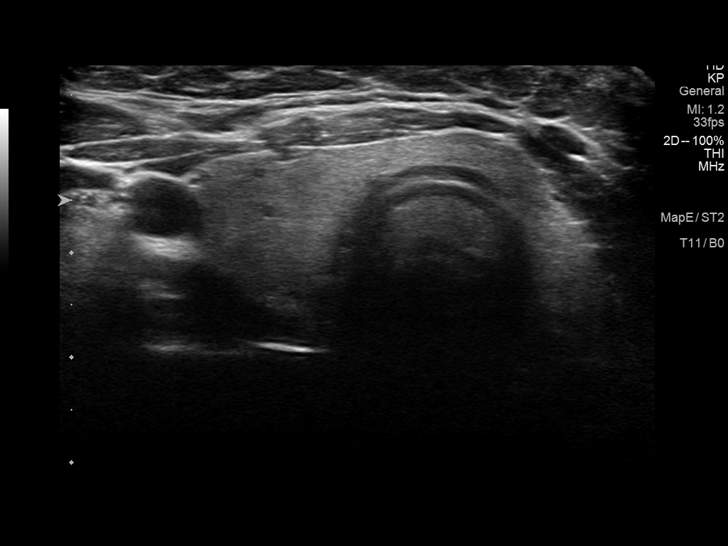
[im 22/44]
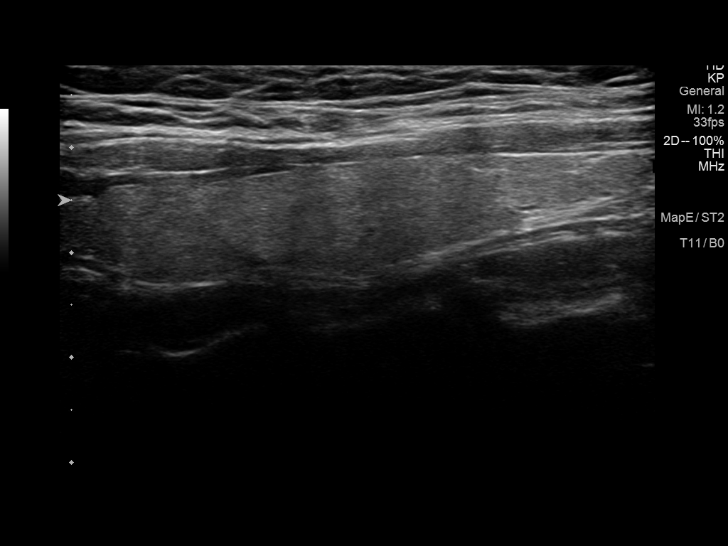
[im 26/44]
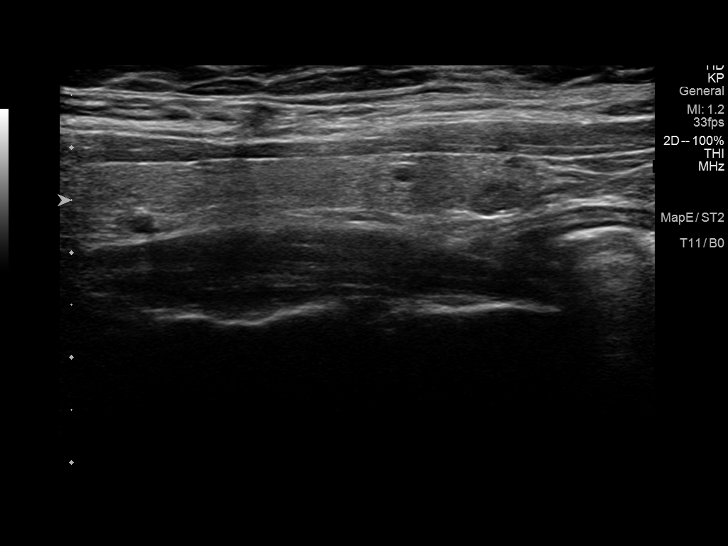
[im 29/44]
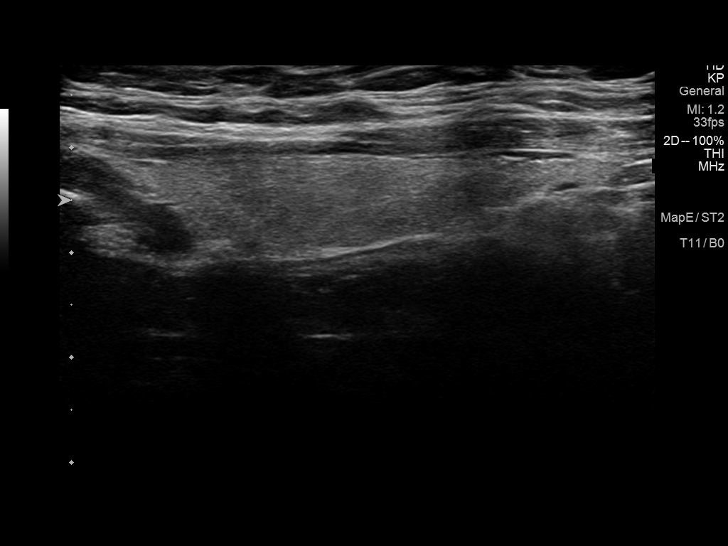
[im 33/44]
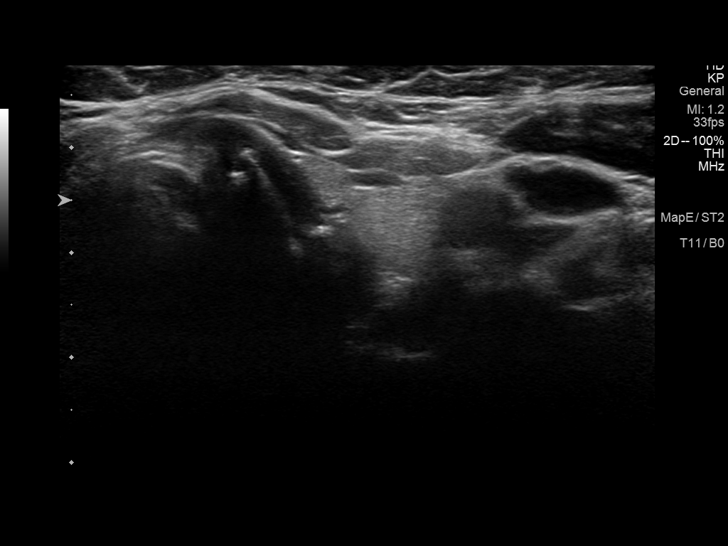
[im 36/44]
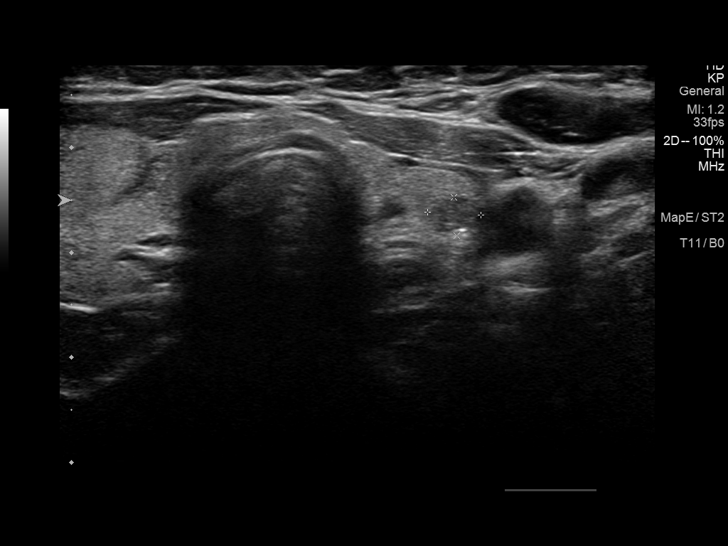
[im 40/44]
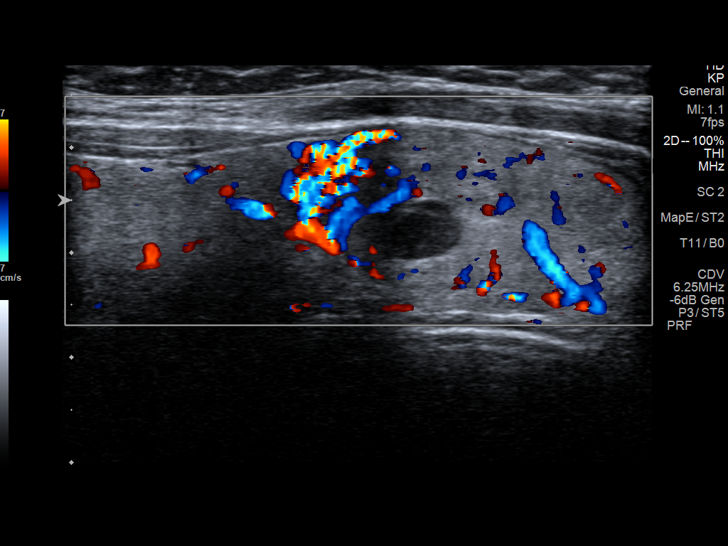
[im 44/44]
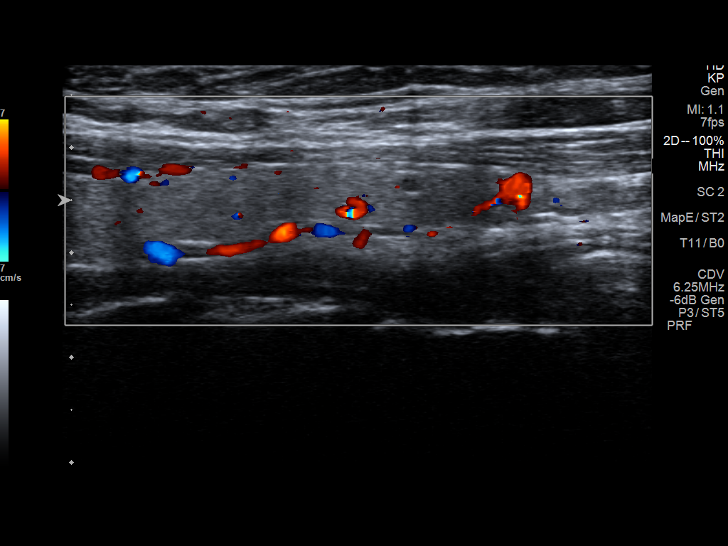

[13 of 25 positions shown; findings below may reference images not displayed]

FINDINGS: Parenchymal Echotexture: Mildly heterogenous

Isthmus: Normal in size measures 0.4 cm in diameter

Right lobe: Enlarged measuring 6.5 x 1.6 x 2.5 cm

Left lobe: Normal in size measuring 5.7 x 1.0 x 1.5 cm

_________________________________________________________

Estimated total number of nodules >/= 1 cm: 1

Number of spongiform nodules >/=  2 cm not described below (TR1): 0

Number of mixed cystic and solid nodules >/= 1.5 cm not described
below (TR2): 0

_________________________________________________________

Nodule # 1:

Location: Right; Mid

Maximum size: 3.6 cm; Other 2 dimensions: 2.6 x 1.5 cm

Composition: solid/almost completely solid (2)

Echogenicity: isoechoic (1)

Shape: not taller-than-wide (0)

Margins: ill-defined (0)

Echogenic foci: none (0)

ACR TI-RADS total points: 3.

ACR TI-RADS risk category: TR3 (3 points).

ACR TI-RADS recommendations:

**Given size (>/= 2.5 cm) and appearance, fine needle aspiration of
this mildly suspicious nodule should be considered based on TI-RADS
criteria.

_________________________________________________________

There is an approximately 0.7 x 0.5 x 0.4 cm ill-defined hypoechoic
nodule/pseudonodule within the inferior pole the left lobe of the
thyroid (labeled 2), which does not meet imaging criteria to
recommend percutaneous sampling or continued dedicated follow-up.
IMPRESSION: The approximately 3.6 cm nodule/mass replacing the mid and inferior
aspects of the right lobe of the thyroid meets imaging criteria to
recommend percutaneous sampling as clinically indicated.

The above is in keeping with the ACR TI-RADS recommendations - [HOSPITAL] [id number];[DATE].

## 2022-03-05 IMAGING — US US THYROID
1 series · 13 of 25 positions shown · non-contrast
Comparison: 01/13/2019

CLINICAL DATA: Thyroid nodule

EXAM:
THYROID ULTRASOUND
TECHNIQUE: Ultrasound examination of the thyroid gland and adjacent soft
tissues was performed.

[Series 1: us thyroid · 0.04mm/px · 13 of 40 slices shown]
[im 1/40]
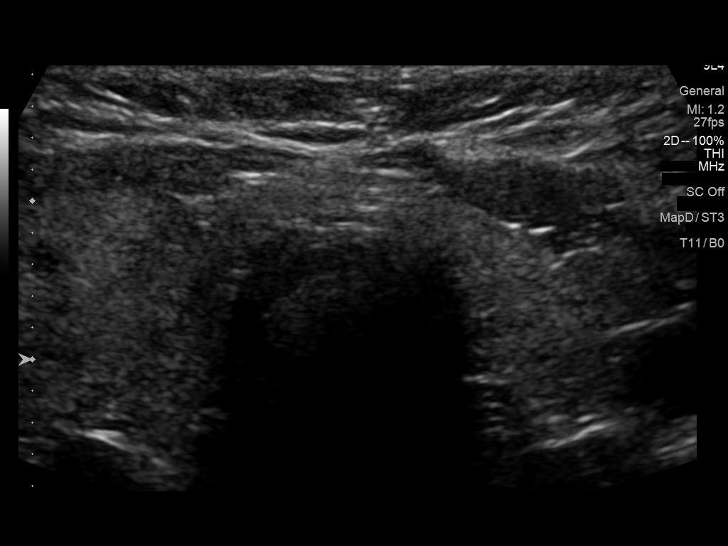
[im 4/40]
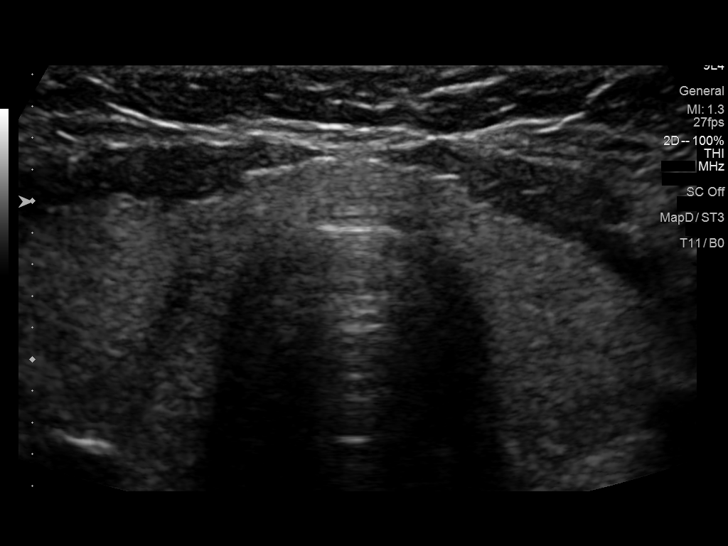
[im 7/40]
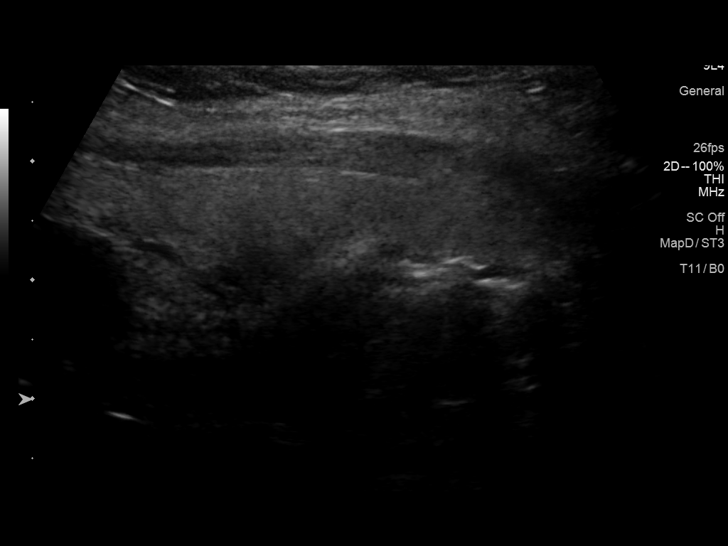
[im 10/40]
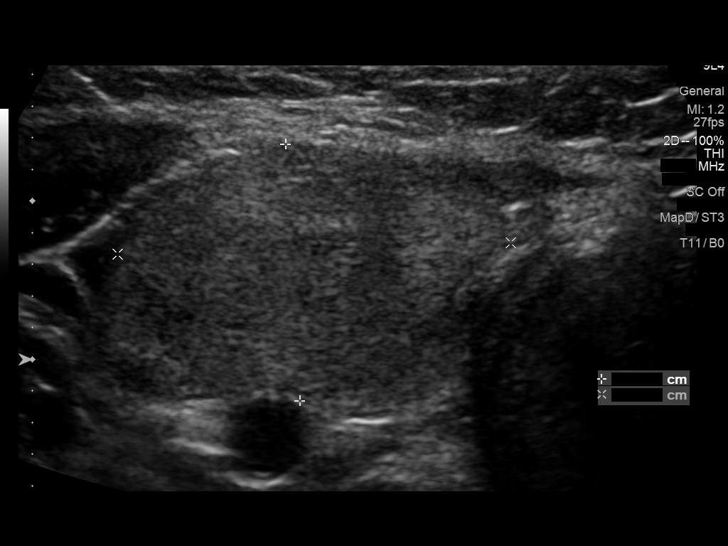
[im 14/40]
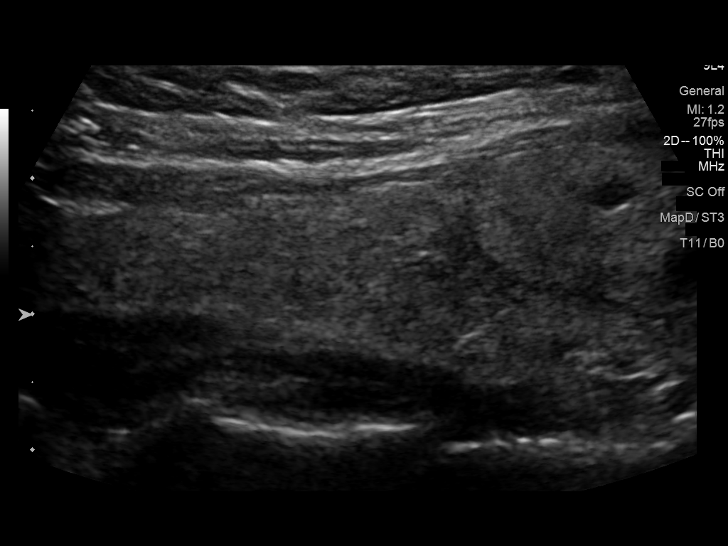
[im 17/40]
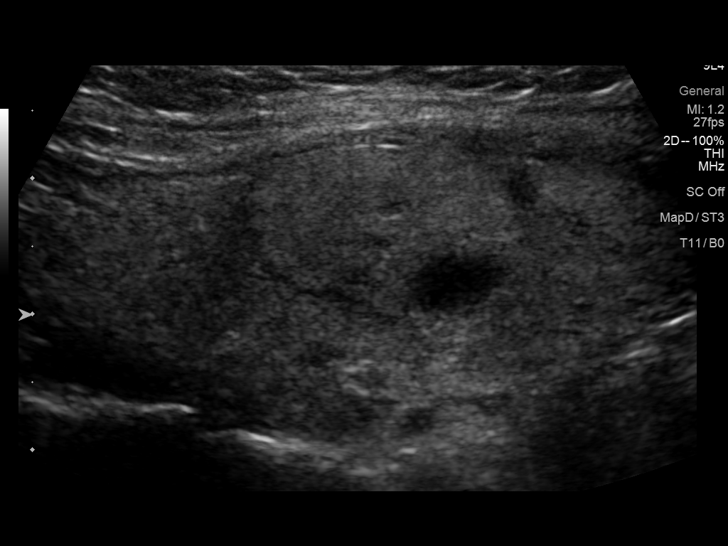
[im 20/40]
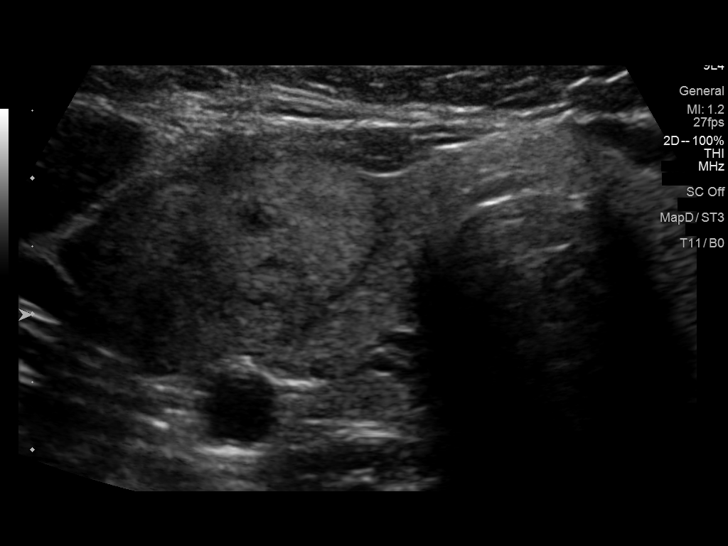
[im 23/40]
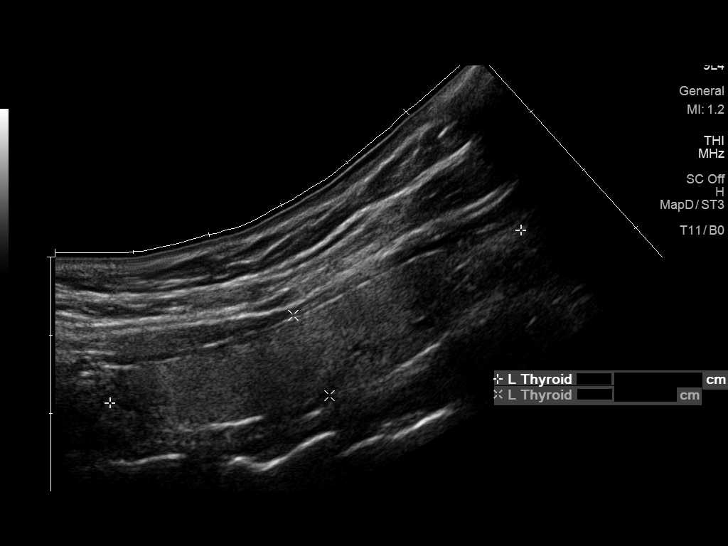
[im 27/40]
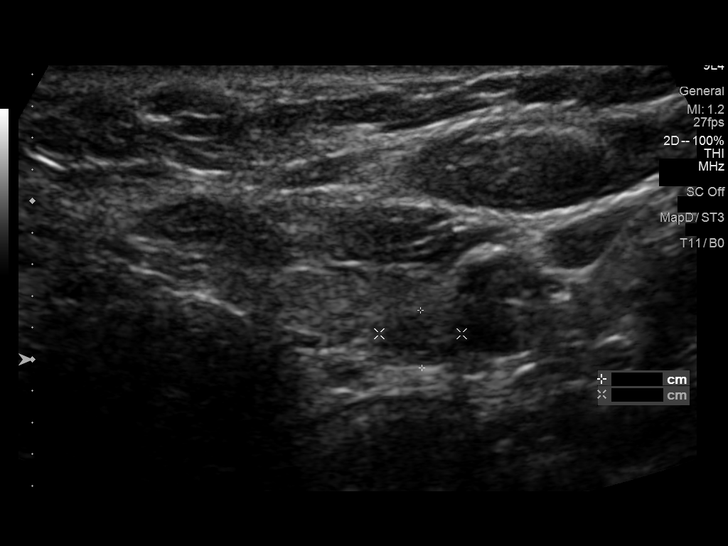
[im 30/40]
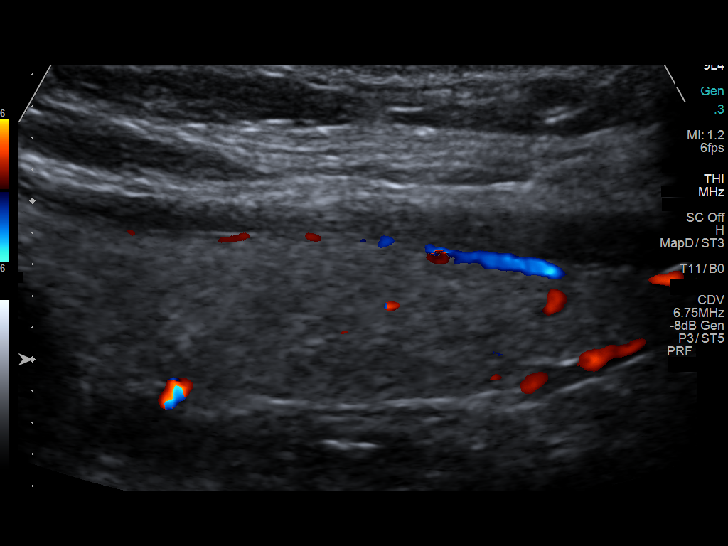
[im 33/40]
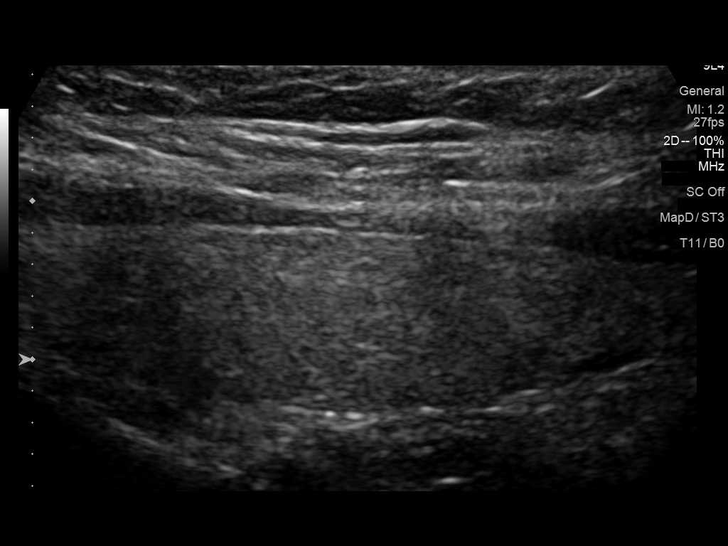
[im 36/40]
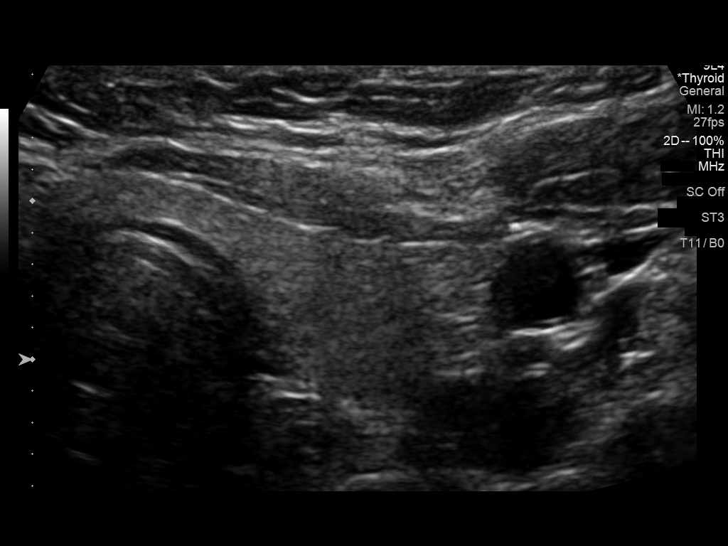
[im 40/40]
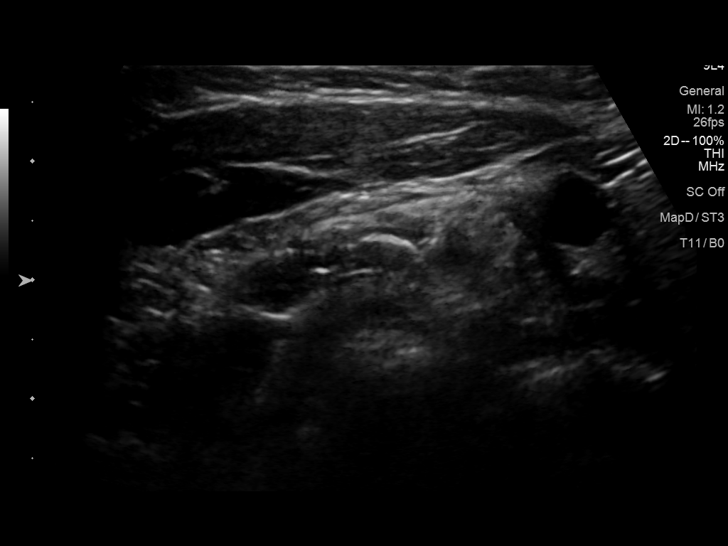

[13 of 25 positions shown; findings below may reference images not displayed]

FINDINGS: Parenchymal Echotexture: Mildly heterogenous

Isthmus: 4 mm

Right lobe: 6.1 x 2.2 x 2.4 cm

Left lobe: 5.7 x 1.1 x 1.6 cm

_________________________________________________________

Estimated total number of nodules >/= 1 cm: 1

Number of spongiform nodules >/=  2 cm not described below (TR1): 0

Number of mixed cystic and solid nodules >/= 1.5 cm not described
below (TR2): 0

_________________________________________________________

Nodule # 1:

Location: Right; Inferior

Maximum size: 3.8, previously 3.6 cm; Other 2 dimensions: 2.5 x
cm

Composition: solid/almost completely solid (2)

Echogenicity: isoechoic (1)

Shape: not taller-than-wide (0)

Margins: ill-defined (0)

Echogenic foci: none (0)

ACR TI-RADS total points: 3.

ACR TI-RADS risk category: TR3 (3 points).

ACR TI-RADS recommendations:

**Given size (>/= 2.5 cm) and appearance, fine needle aspiration of
this mildly suspicious nodule should be considered based on TI-RADS
criteria.

_________________________________________________________

Stable 8 mm left inferior thyroid hypoechoic nodule which does not
meet criteria for any biopsy or follow-up.

Normal vascularity.  No regional adenopathy.
IMPRESSION: 3.8 cm right inferior TR 3 nodule meets criteria for biopsy as
above. No significant interval change compared to 01/13/2019.

The above is in keeping with the ACR TI-RADS recommendations - [HOSPITAL] [id number];[DATE].
